# Patient Record
Sex: Female | Born: 1985 | Race: Asian | Hispanic: No | Marital: Married | Smoking: Never smoker
Health system: Southern US, Community
[De-identification: ages and names within clinical notes are randomized; demographics above are authoritative.]

---

## 2012-11-08 ENCOUNTER — Other Ambulatory Visit (HOSPITAL_COMMUNITY)
Admission: RE | Admit: 2012-11-08 | Discharge: 2012-11-08 | Disposition: A | Discharge status: Home / Self Care | Source: Ambulatory Visit | Attending: Obstetrics and Gynecology | Admitting: Obstetrics and Gynecology

## 2012-11-08 ENCOUNTER — Encounter: Payer: Self-pay | Admitting: Obstetrics and Gynecology

## 2012-11-08 ENCOUNTER — Ambulatory Visit (INDEPENDENT_AMBULATORY_CARE_PROVIDER_SITE_OTHER): Admitting: Obstetrics and Gynecology

## 2012-11-08 VITALS — BP 128/78 | HR 70 | Resp 16 | Ht 61.0 in | Wt 148.0 lb

## 2012-11-08 DIAGNOSIS — Z113 Encounter for screening for infections with a predominantly sexual mode of transmission: Secondary | ICD-10-CM | POA: Insufficient documentation

## 2012-11-08 DIAGNOSIS — N914 Secondary oligomenorrhea: Secondary | ICD-10-CM

## 2012-11-08 DIAGNOSIS — Z01419 Encounter for gynecological examination (general) (routine) without abnormal findings: Secondary | ICD-10-CM | POA: Insufficient documentation

## 2012-11-08 DIAGNOSIS — N915 Oligomenorrhea, unspecified: Secondary | ICD-10-CM

## 2012-11-08 DIAGNOSIS — Z124 Encounter for screening for malignant neoplasm of cervix: Secondary | ICD-10-CM

## 2012-11-08 NOTE — Progress Notes (Signed)
  Subjective:     Stephanie Lin is a 27 y.o. female [phone] with LMP 10/08/2012 and BMI 27 who is here for a comprehensive physical exam. The patient reports irregular cycles. Patient also reports presence of facial hair and excessive weight gain. Patient reports a 20 lb weight gain over the past year.She is in the [employer], exercises 5 days a week for 30-45 minutes. She admits to increase in appetite and the onset of change in her cycle over the past year as well. Patient reports menarche at the age of 42. She always had normal monthly cycles lasting 6-8 days. Approximately, a year after the birth of her son, her periods continued to occur monthly but lasted fewer days with the last one in May consisting of 1 day of spotting. Patient became emotional at the thought of something being wrong with her. She would like to return to the [employer] but cannot with the excessive weight gain. She also would like to conceive another child next year but would like to make sure everything is okay with her. About a year ago as well, she noticed coarse facial hair, particularly on shin and sideburns. She reports always having hair on her upper lip which she waxes off. Patient had normal TSH with PCP in April. Patient would also like to be evaluated for BV. She reports a vaginal odor without an abnormal discharge  History   Social History  . Marital Status: Married    Spouse Name: N/A    Number of Children: N/A  . Years of Education: N/A   Occupational History  . Not on file.   Social History Main Topics  . Smoking status: Never Smoker   . Smokeless tobacco: Not on file  . Alcohol Use: Yes     Comment: social  . Drug Use: No  . Sexually Active: Yes   Other Topics Concern  . Not on file   Social History Narrative  . No narrative on file   No health maintenance topics applied.     Review of Systems A comprehensive review of systems was negative.   Objective:      GENERAL: Well-developed,  well-nourished female in no acute distress.  HEENT: Normocephalic, atraumatic. Sclerae anicteric.  NECK: Supple. Normal thyroid.  LUNGS: Clear to auscultation bilaterally.  HEART: Regular rate and rhythm. BREASTS: Symmetric in size. No palpable masses or lymphadenopathy, skin changes, or nipple drainage. ABDOMEN: Soft, nontender, nondistended. No organomegaly. PELVIC: Normal external female genitalia. Vagina is pink and rugated.  Normal discharge. Normal appearing cervix. Uterus is normal in size. No adnexal mass or tenderness. EXTREMITIES: No cyanosis, clubbing, or edema, 2+ distal pulses.    Assessment:    Healthy female exam.      Plan:    pap smear collected and wet prep FSH, LH testosterone also ordered Pelvic ultrasound Patient planning on returning to [place] on Thursday. Will call with results See After Visit Summary for Counseling Recommendations

## 2012-11-08 NOTE — Patient Instructions (Signed)
Preventive Care for Adults, Female A healthy lifestyle and preventive care can promote health and wellness. Preventive health guidelines for women include the following key practices.  A routine yearly physical is a good way to check with your caregiver about your health and preventive screening. It is a chance to share any concerns and updates on your health, and to receive a thorough exam.  Visit your dentist for a routine exam and preventive care every 6 months. Brush your teeth twice a day and floss once a day. Good oral hygiene prevents tooth decay and gum disease.  The frequency of eye exams is based on your age, health, family medical history, use of contact lenses, and other factors. Follow your caregiver's recommendations for frequency of eye exams.  Eat a healthy diet. Foods like vegetables, fruits, whole grains, low-fat dairy products, and lean protein foods contain the nutrients you need without too many calories. Decrease your intake of foods high in solid fats, added sugars, and salt. Eat the right amount of calories for you.Get information about a proper diet from your caregiver, if necessary.  Regular physical exercise is one of the most important things you can do for your health. Most adults should get at least 150 minutes of moderate-intensity exercise (any activity that increases your heart rate and causes you to sweat) each week. In addition, most adults need muscle-strengthening exercises on 2 or more days a week.  Maintain a healthy weight. The body mass index (BMI) is a screening tool to identify possible weight problems. It provides an estimate of body fat based on height and weight. Your caregiver can help determine your BMI, and can help you achieve or maintain a healthy weight.For adults 20 years and older:  A BMI below 18.5 is considered underweight.  A BMI of 18.5 to 24.9 is normal.  A BMI of 25 to 29.9 is considered overweight.  A BMI of 30 and above is  considered obese.  Maintain normal blood lipids and cholesterol levels by exercising and minimizing your intake of saturated fat. Eat a balanced diet with plenty of fruit and vegetables. Blood tests for lipids and cholesterol should begin at age 20 and be repeated every 5 years. If your lipid or cholesterol levels are high, you are over 50, or you are at high risk for heart disease, you may need your cholesterol levels checked more frequently.Ongoing high lipid and cholesterol levels should be treated with medicines if diet and exercise are not effective.  If you smoke, find out from your caregiver how to quit. If you do not use tobacco, do not start.  If you are pregnant, do not drink alcohol. If you are breastfeeding, be very cautious about drinking alcohol. If you are not pregnant and choose to drink alcohol, do not exceed 1 drink per day. One drink is considered to be 12 ounces (355 mL) of beer, 5 ounces (148 mL) of wine, or 1.5 ounces (44 mL) of liquor.  Avoid use of street drugs. Do not share needles with anyone. Ask for help if you need support or instructions about stopping the use of drugs.  High blood pressure causes heart disease and increases the risk of stroke. Your blood pressure should be checked at least every 1 to 2 years. Ongoing high blood pressure should be treated with medicines if weight loss and exercise are not effective.  If you are 55 to 25 years old, ask your caregiver if you should take aspirin to prevent strokes.  Diabetes   screening involves taking a blood sample to check your fasting blood sugar level. This should be done once every 3 years, after age 45, if you are within normal weight and without risk factors for diabetes. Testing should be considered at a younger age or be carried out more frequently if you are overweight and have at least 1 risk factor for diabetes.  Breast cancer screening is essential preventive care for women. You should practice "breast  self-awareness." This means understanding the normal appearance and feel of your breasts and may include breast self-examination. Any changes detected, no matter how small, should be reported to a caregiver. Women in their 20s and 30s should have a clinical breast exam (CBE) by a caregiver as part of a regular health exam every 1 to 3 years. After age 40, women should have a CBE every year. Starting at age 40, women should consider having a mammography (breast X-ray test) every year. Women who have a family history of breast cancer should talk to their caregiver about genetic screening. Women at a high risk of breast cancer should talk to their caregivers about having magnetic resonance imaging (MRI) and a mammography every year.  The Pap test is a screening test for cervical cancer. A Pap test can show cell changes on the cervix that might become cervical cancer if left untreated. A Pap test is a procedure in which cells are obtained and examined from the lower end of the uterus (cervix).  Women should have a Pap test starting at age 21.  Between ages 21 and 29, Pap tests should be repeated every 2 years.  Beginning at age 30, you should have a Pap test every 3 years as long as the past 3 Pap tests have been normal.  Some women have medical problems that increase the chance of getting cervical cancer. Talk to your caregiver about these problems. It is especially important to talk to your caregiver if a new problem develops soon after your last Pap test. In these cases, your caregiver may recommend more frequent screening and Pap tests.  The above recommendations are the same for women who have or have not gotten the vaccine for human papillomavirus (HPV).  If you had a hysterectomy for a problem that was not cancer or a condition that could lead to cancer, then you no longer need Pap tests. Even if you no longer need a Pap test, a regular exam is a good idea to make sure no other problems are  starting.  If you are between ages 65 and 70, and you have had normal Pap tests going back 10 years, you no longer need Pap tests. Even if you no longer need a Pap test, a regular exam is a good idea to make sure no other problems are starting.  If you have had past treatment for cervical cancer or a condition that could lead to cancer, you need Pap tests and screening for cancer for at least 20 years after your treatment.  If Pap tests have been discontinued, risk factors (such as a new sexual partner) need to be reassessed to determine if screening should be resumed.  The HPV test is an additional test that may be used for cervical cancer screening. The HPV test looks for the virus that can cause the cell changes on the cervix. The cells collected during the Pap test can be tested for HPV. The HPV test could be used to screen women aged 30 years and older, and should   be used in women of any age who have unclear Pap test results. After the age of 30, women should have HPV testing at the same frequency as a Pap test.  Colorectal cancer can be detected and often prevented. Most routine colorectal cancer screening begins at the age of 50 and continues through age 75. However, your caregiver may recommend screening at an earlier age if you have risk factors for colon cancer. On a yearly basis, your caregiver may provide home test kits to check for hidden blood in the stool. Use of a small camera at the end of a tube, to directly examine the colon (sigmoidoscopy or colonoscopy), can detect the earliest forms of colorectal cancer. Talk to your caregiver about this at age 50, when routine screening begins. Direct examination of the colon should be repeated every 5 to 10 years through age 75, unless early forms of pre-cancerous polyps or small growths are found.  Hepatitis C blood testing is recommended for all people born from 1945 through 1965 and any individual with known risks for hepatitis C.  Practice  safe sex. Use condoms and avoid high-risk sexual practices to reduce the spread of sexually transmitted infections (STIs). STIs include gonorrhea, chlamydia, syphilis, trichomonas, herpes, HPV, and human immunodeficiency virus (HIV). Herpes, HIV, and HPV are viral illnesses that have no cure. They can result in disability, cancer, and death. Sexually active women aged 25 and younger should be checked for chlamydia. Older women with new or multiple partners should also be tested for chlamydia. Testing for other STIs is recommended if you are sexually active and at increased risk.  Osteoporosis is a disease in which the bones lose minerals and strength with aging. This can result in serious bone fractures. The risk of osteoporosis can be identified using a bone density scan. Women ages 65 and over and women at risk for fractures or osteoporosis should discuss screening with their caregivers. Ask your caregiver whether you should take a calcium supplement or vitamin D to reduce the rate of osteoporosis.  Menopause can be associated with physical symptoms and risks. Hormone replacement therapy is available to decrease symptoms and risks. You should talk to your caregiver about whether hormone replacement therapy is right for you.  Use sunscreen with sun protection factor (SPF) of 30 or more. Apply sunscreen liberally and repeatedly throughout the day. You should seek shade when your shadow is shorter than you. Protect yourself by wearing long sleeves, pants, a wide-brimmed hat, and sunglasses year round, whenever you are outdoors.  Once a month, do a whole body skin exam, using a mirror to look at the skin on your back. Notify your caregiver of new moles, moles that have irregular borders, moles that are larger than a pencil eraser, or moles that have changed in shape or color.  Stay current with required immunizations.  Influenza. You need a dose every fall (or winter). The composition of the flu vaccine  changes each year, so being vaccinated once is not enough.  Pneumococcal polysaccharide. You need 1 to 2 doses if you smoke cigarettes or if you have certain chronic medical conditions. You need 1 dose at age 65 (or older) if you have never been vaccinated.  Tetanus, diphtheria, pertussis (Tdap, Td). Get 1 dose of Tdap vaccine if you are younger than age 65, are over 65 and have contact with an infant, are a healthcare worker, are pregnant, or simply want to be protected from whooping cough. After that, you need a Td   booster dose every 10 years. Consult your caregiver if you have not had at least 3 tetanus and diphtheria-containing shots sometime in your life or have a deep or dirty wound.  HPV. You need this vaccine if you are a woman age 26 or younger. The vaccine is given in 3 doses over 6 months.  Measles, mumps, rubella (MMR). You need at least 1 dose of MMR if you were born in 1957 or later. You may also need a second dose.  Meningococcal. If you are age 19 to 21 and a first-year college student living in a residence hall, or have one of several medical conditions, you need to get vaccinated against meningococcal disease. You may also need additional booster doses.  Zoster (shingles). If you are age 60 or older, you should get this vaccine.  Varicella (chickenpox). If you have never had chickenpox or you were vaccinated but received only 1 dose, talk to your caregiver to find out if you need this vaccine.  Hepatitis A. You need this vaccine if you have a specific risk factor for hepatitis A virus infection or you simply wish to be protected from this disease. The vaccine is usually given as 2 doses, 6 to 18 months apart.  Hepatitis B. You need this vaccine if you have a specific risk factor for hepatitis B virus infection or you simply wish to be protected from this disease. The vaccine is given in 3 doses, usually over 6 months. Preventive Services / Frequency Ages 19 to 39  Blood  pressure check.** / Every 1 to 2 years.  Lipid and cholesterol check.** / Every 5 years beginning at age 20.  Clinical breast exam.** / Every 3 years for women in their 20s and 30s.  Pap test.** / Every 2 years from ages 21 through 29. Every 3 years starting at age 30 through age 65 or 70 with a history of 3 consecutive normal Pap tests.  HPV screening.** / Every 3 years from ages 30 through ages 65 to 70 with a history of 3 consecutive normal Pap tests.  Hepatitis C blood test.** / For any individual with known risks for hepatitis C.  Skin self-exam. / Monthly.  Influenza immunization.** / Every year.  Pneumococcal polysaccharide immunization.** / 1 to 2 doses if you smoke cigarettes or if you have certain chronic medical conditions.  Tetanus, diphtheria, pertussis (Tdap, Td) immunization. / A one-time dose of Tdap vaccine. After that, you need a Td booster dose every 10 years.  HPV immunization. / 3 doses over 6 months, if you are 26 and younger.  Measles, mumps, rubella (MMR) immunization. / You need at least 1 dose of MMR if you were born in 1957 or later. You may also need a second dose.  Meningococcal immunization. / 1 dose if you are age 19 to 21 and a first-year college student living in a residence hall, or have one of several medical conditions, you need to get vaccinated against meningococcal disease. You may also need additional booster doses.  Varicella immunization.** / Consult your caregiver.  Hepatitis A immunization.** / Consult your caregiver. 2 doses, 6 to 18 months apart.  Hepatitis B immunization.** / Consult your caregiver. 3 doses usually over 6 months. Ages 40 to 64  Blood pressure check.** / Every 1 to 2 years.  Lipid and cholesterol check.** / Every 5 years beginning at age 20.  Clinical breast exam.** / Every year after age 40.  Mammogram.** / Every year beginning at age 40   and continuing for as long as you are in good health. Consult with your  caregiver.  Pap test.** / Every 3 years starting at age 30 through age 65 or 70 with a history of 3 consecutive normal Pap tests.  HPV screening.** / Every 3 years from ages 30 through ages 65 to 70 with a history of 3 consecutive normal Pap tests.  Fecal occult blood test (FOBT) of stool. / Every year beginning at age 50 and continuing until age 75. You may not need to do this test if you get a colonoscopy every 10 years.  Flexible sigmoidoscopy or colonoscopy.** / Every 5 years for a flexible sigmoidoscopy or every 10 years for a colonoscopy beginning at age 50 and continuing until age 75.  Hepatitis C blood test.** / For all people born from 1945 through 1965 and any individual with known risks for hepatitis C.  Skin self-exam. / Monthly.  Influenza immunization.** / Every year.  Pneumococcal polysaccharide immunization.** / 1 to 2 doses if you smoke cigarettes or if you have certain chronic medical conditions.  Tetanus, diphtheria, pertussis (Tdap, Td) immunization.** / A one-time dose of Tdap vaccine. After that, you need a Td booster dose every 10 years.  Measles, mumps, rubella (MMR) immunization. / You need at least 1 dose of MMR if you were born in 1957 or later. You may also need a second dose.  Varicella immunization.** / Consult your caregiver.  Meningococcal immunization.** / Consult your caregiver.  Hepatitis A immunization.** / Consult your caregiver. 2 doses, 6 to 18 months apart.  Hepatitis B immunization.** / Consult your caregiver. 3 doses, usually over 6 months. Ages 65 and over  Blood pressure check.** / Every 1 to 2 years.  Lipid and cholesterol check.** / Every 5 years beginning at age 20.  Clinical breast exam.** / Every year after age 40.  Mammogram.** / Every year beginning at age 40 and continuing for as long as you are in good health. Consult with your caregiver.  Pap test.** / Every 3 years starting at age 30 through age 65 or 70 with a 3  consecutive normal Pap tests. Testing can be stopped between 65 and 70 with 3 consecutive normal Pap tests and no abnormal Pap or HPV tests in the past 10 years.  HPV screening.** / Every 3 years from ages 30 through ages 65 or 70 with a history of 3 consecutive normal Pap tests. Testing can be stopped between 65 and 70 with 3 consecutive normal Pap tests and no abnormal Pap or HPV tests in the past 10 years.  Fecal occult blood test (FOBT) of stool. / Every year beginning at age 50 and continuing until age 75. You may not need to do this test if you get a colonoscopy every 10 years.  Flexible sigmoidoscopy or colonoscopy.** / Every 5 years for a flexible sigmoidoscopy or every 10 years for a colonoscopy beginning at age 50 and continuing until age 75.  Hepatitis C blood test.** / For all people born from 1945 through 1965 and any individual with known risks for hepatitis C.  Osteoporosis screening.** / A one-time screening for women ages 65 and over and women at risk for fractures or osteoporosis.  Skin self-exam. / Monthly.  Influenza immunization.** / Every year.  Pneumococcal polysaccharide immunization.** / 1 dose at age 65 (or older) if you have never been vaccinated.  Tetanus, diphtheria, pertussis (Tdap, Td) immunization. / A one-time dose of Tdap vaccine if you are over   65 and have contact with an infant, are a healthcare worker, or simply want to be protected from whooping cough. After that, you need a Td booster dose every 10 years.  Varicella immunization.** / Consult your caregiver.  Meningococcal immunization.** / Consult your caregiver.  Hepatitis A immunization.** / Consult your caregiver. 2 doses, 6 to 18 months apart.  Hepatitis B immunization.** / Check with your caregiver. 3 doses, usually over 6 months. ** Family history and personal history of risk and conditions may change your caregiver's recommendations. Document Released: 07/14/2001 Document Revised: 08/10/2011  Document Reviewed: 10/13/2010 [employer] Patient Information 2014 [employer].  

## 2012-11-09 ENCOUNTER — Ambulatory Visit (HOSPITAL_BASED_OUTPATIENT_CLINIC_OR_DEPARTMENT_OTHER)
Admission: RE | Admit: 2012-11-09 | Discharge: 2012-11-09 | Disposition: A | Discharge status: Home / Self Care | Source: Ambulatory Visit | Attending: Obstetrics and Gynecology | Admitting: Obstetrics and Gynecology

## 2012-11-09 DIAGNOSIS — N914 Secondary oligomenorrhea: Secondary | ICD-10-CM

## 2012-11-09 DIAGNOSIS — N926 Irregular menstruation, unspecified: Secondary | ICD-10-CM | POA: Insufficient documentation

## 2012-11-09 DIAGNOSIS — N83209 Unspecified ovarian cyst, unspecified side: Secondary | ICD-10-CM | POA: Insufficient documentation

## 2012-11-09 DIAGNOSIS — R635 Abnormal weight gain: Secondary | ICD-10-CM | POA: Insufficient documentation

## 2012-11-09 LAB — WET PREP, GENITAL: Yeast Wet Prep HPF POC: NONE SEEN

## 2012-11-09 LAB — TESTOSTERONE: Testosterone: 60 ng/dL (ref 10–70)

## 2013-11-15 LAB — HM MAMMOGRAPHY

## 2014-04-02 ENCOUNTER — Encounter: Payer: Self-pay | Admitting: Obstetrics and Gynecology

## 2014-11-19 ENCOUNTER — Other Ambulatory Visit: Payer: Self-pay | Admitting: Gynecology

## 2015-07-26 ENCOUNTER — Encounter: Payer: Self-pay | Admitting: Physician Assistant

## 2015-07-26 ENCOUNTER — Ambulatory Visit (INDEPENDENT_AMBULATORY_CARE_PROVIDER_SITE_OTHER): Admitting: Physician Assistant

## 2015-07-26 VITALS — BP 128/78 | HR 74 | Ht 61.0 in | Wt 143.0 lb

## 2015-07-26 DIAGNOSIS — R635 Abnormal weight gain: Secondary | ICD-10-CM

## 2015-07-26 DIAGNOSIS — N3001 Acute cystitis with hematuria: Secondary | ICD-10-CM

## 2015-07-26 DIAGNOSIS — R3915 Urgency of urination: Secondary | ICD-10-CM

## 2015-07-26 DIAGNOSIS — R300 Dysuria: Secondary | ICD-10-CM | POA: Diagnosis not present

## 2015-07-26 DIAGNOSIS — M545 Low back pain, unspecified: Secondary | ICD-10-CM

## 2015-07-26 LAB — POCT URINALYSIS DIPSTICK
BILIRUBIN UA: NEGATIVE
GLUCOSE UA: NEGATIVE
Ketones, UA: NEGATIVE
NITRITE UA: NEGATIVE
PH UA: 6
Protein, UA: NEGATIVE
Spec Grav, UA: 1.01
Urobilinogen, UA: 0.2

## 2015-07-26 MED ORDER — AMBULATORY NON FORMULARY MEDICATION: Status: DC

## 2015-07-26 MED ORDER — PHENTERMINE HCL 37.5 MG PO TABS: 37.5000 mg | ORAL_TABLET | Freq: Every day | ORAL | Status: DC

## 2015-07-26 MED ORDER — CIPROFLOXACIN HCL 500 MG PO TABS: 500.0000 mg | ORAL_TABLET | Freq: Two times a day (BID) | ORAL | Status: DC

## 2015-07-26 NOTE — Progress Notes (Signed)
Subjective:    Patient ID: Stephanie Lin, female    DOB: [birth year], 30 y.o.   MRN: [id number]  HPI  Pt is a 30 yo female who presents to the clinic to establish care.   .. Active Ambulatory Problems    Diagnosis Date Noted  . No Active Ambulatory Problems   Resolved Ambulatory Problems    Diagnosis Date Noted  . No Resolved Ambulatory Problems   No Additional Past Medical History   .Marland Kitchen Family History  Problem Relation Age of Onset  . Alcohol abuse Father    .Marland Kitchen Social History   Social History  . Marital Status: Married    Spouse Name: N/A  . Number of Children: N/A  . Years of Education: N/A   Occupational History  . Not on file.   Social History Main Topics  . Smoking status: Never Smoker   . Smokeless tobacco: Not on file  . Alcohol Use: No     Comment: social  . Drug Use: No  . Sexual Activity: Not Currently   Other Topics Concern  . Not on file   Social History Narrative   She has had 1 week of urgency and dysuria. No fever, chills, n/v/d, body aches. Hx of UTI's.   She would like to lose weight. Her husband is cheating on her and she wants to make changes before he leaves her. She has not tried anything.   She does complain of left lower back pain without radiation. No bowel/bladder dysfunction or saddle anthesthesia. [place] in [place] in 09. She was 100 pounds and carring a lot of heavy equipment. No imaging has been done. PT has helped would like to do again. At times feels like left side gives way. She burns and feels tight.     Review of Systems  All other systems reviewed and are negative.      Objective:   Physical Exam  Constitutional: She is oriented to person, place, and time. She appears well-developed and well-nourished.  HENT:  Head: Normocephalic and atraumatic.  Cardiovascular: Normal rate, regular rhythm and normal heart sounds.   Pulmonary/Chest: Effort normal and breath sounds normal.  No CVA tenderness.   Abdominal:  Soft. Bowel sounds are normal. She exhibits no distension and no mass. There is no tenderness. There is no rebound and no guarding.  Adiposity of abdomen with abdominal stretch marks.   Musculoskeletal:  Normal ROM at waist and bilateral lower extermities.  Bilateral patellar reflexes 2+ and symmetric.  Negative straight leg test.  Lumbar left sided paraspinous tenderness and tightness.  Pain induced with internal ROM of left leg.   Neurological: She is alert and oriented to person, place, and time.  Skin: Skin is dry.  Psychiatric: She has a normal mood and affect. Her behavior is normal.          Assessment & Plan:  Acute cystitis with hematuria-.. Results for orders placed or performed in visit on 07/26/15  POCT urinalysis dipstick  Result Value Ref Range   Color, UA yellow    Clarity, UA slightly cloudy    Glucose, UA neg    Bilirubin, UA neg    Ketones, UA neg    Spec Grav, UA 1.010    Blood, UA trace-lysed    pH, UA 6.0    Protein, UA neg    Urobilinogen, UA 0.2    Nitrite, UA neg    Leukocytes, UA Trace (A) Negative   Will treat with cipro. Will  culture. Discussed symptomatic care.   Low back pain, left sided without sciatica- lumbar xray ordered today.  seems more muscular and likely the piriformis. Gave stretches. Ordered PT. Discussed massage, heat, ice and biofreeze. Follow up as needed. Follow up after 4 weeks of PT. Continue NSAIDs as needed.   Abnormal weight gain- discussed options. Interested in cool sculpting but told her I think she would benefit from weight loss first. Phentermine given. Discussed side effects. Follow up in 1 month nurse visit. Encouraged patient to keep to 1500 calorie diet and regular exercise.

## 2015-07-26 NOTE — Patient Instructions (Signed)
Piriformis Syndrome With Rehab Piriformis syndrome is a condition the affects the nervous system in the area of the hip, and is characterized by pain and possibly a loss of feeling in the backside (posterior) thigh that may extend down the entire length of the leg. The symptoms are caused by an increase in pressure on the sciatic nerve by the piriformis muscle, which is on the back of the hip and is responsible for externally rotating the hip. The sciatic nerve and its branches connect to much of the leg. Normally the sciatic nerve runs between the piriformis muscle and other muscles. However, in certain individuals the nerve runs through the muscle, which causes an increase in pressure on the nerve and results in the symptoms of piriformis syndrome. SYMPTOMS   Pain, tingling, numbness, or burning in the back of the thigh that may also extend down the entire leg.  Occasionally, tenderness in the buttock.  Loss of function of the leg.  Pain that worsens when using the piriformis muscle (running, jumping, or stairs).  Pain that increases with prolonged sitting.  Pain that is lessened by lying flat on the back. CAUSES   Piriformis syndrome is the result of an increase in pressure placed on the sciatic nerve. Oftentimes, piriformis syndrome is an overuse injury.  Stress placed on the nerve from a sudden increase in the intensity, frequency, or duration of training.  Compensation of other extremity injuries. RISK INCREASES WITH:  Sports that involve the piriformis muscle (running, walking, or jumping).  You are born with (congenital) a defect in which the sciatic nerve passes through the muscle. PREVENTION  Warm up and stretch properly before activity.  Allow for adequate recovery between workouts.  Maintain physical fitness:  Strength, flexibility, and endurance.  Cardiovascular fitness. PROGNOSIS  If treated properly, the symptoms of piriformis syndrome usually resolve in 2 to 6  weeks. RELATED COMPLICATIONS   Persistent and possibly permanent pain and numbness in the lower extremity.  Weakness of the extremity that may progress to disability and inability to compete. TREATMENT  The most effective treatment for piriformis syndrome is rest from any activities that aggravate the symptoms. Ice and pain medication may help reduce pain and inflammation. The use of strengthening and stretching exercises may help reduce pain with activity. These exercises may be performed at home or with a therapist. A referral to a therapist may be given for further evaluation and treatment, such as ultrasound. Corticosteroid injections may be given to reduce inflammation that is causing pressure to be placed on the sciatic nerve. If nonsurgical (conservative) treatment is unsuccessful, then surgery may be recommended.  MEDICATION   If pain medication is necessary, then nonsteroidal anti-inflammatory medications, such as aspirin and ibuprofen, or other minor pain relievers, such as acetaminophen, are often recommended.  Do not take pain medication for 7 days before surgery.  Prescription pain relievers may be given if deemed necessary by your caregiver. Use only as directed and only as much as you need.  Corticosteroid injections may be given by your caregiver. These injections should be reserved for the most serious cases, because they may only be given a certain number of times. HEAT AND COLD:   Cold treatment (icing) relieves pain and reduces inflammation. Cold treatment should be applied for 10 to 15 minutes every 2 to 3 hours for inflammation and pain and immediately after any activity that aggravates your symptoms. Use ice packs or massage the area with a piece of ice (ice massage).  Heat   treatment may be used prior to performing the stretching and strengthening activities prescribed by your caregiver, physical therapist, or athletic trainer. Use a heat pack or soak the injury in warm  water. SEEK IMMEDIATE MEDICAL CARE IF:  Treatment seems to offer no benefit, or the condition worsens.  Any medications produce adverse side effects. EXERCISES RANGE OF MOTION (ROM) AND STRETCHING EXERCISES - Piriformis Syndrome These exercises may help you when beginning to rehabilitate your injury. Your symptoms may resolve with or without further involvement from your physician, physical therapist, or athletic trainer. While completing these exercises, remember:   Restoring tissue flexibility helps normal motion to return to the joints. This allows healthier, less painful movement and activity.  An effective stretch should be held for at least 30 seconds.  A stretch should never be painful. You should only feel a gentle lengthening or release in the stretched tissue. STRETCH - Hip Rotators  Lie on your back on a firm surface. Grasp your right / left knee with your right / left hand and your ankle with your opposite hand.  Keeping your hips and shoulders firmly planted, gently pull your right / left knee and rotate your lower leg toward your opposite shoulder until you feel a stretch in your buttocks.  Hold this stretch for __________ seconds. Repeat this stretch __________ times. Complete this stretch __________ times per day. STRETCH - Iliotibial Band  On the floor or bed, lie on your side so your right / left leg is on top. Bend your knee and grab your ankle.  Slowly bring your knee back so that your thigh is in line with your trunk. Keep your heel at your buttocks and gently arch your back so your head, shoulders, and hips line up.  Slowly lower your leg so that your knee approaches the floor/bed until you feel a gentle stretch on the outside of your right / left thigh. If you do not feel a stretch and your knee will not fall farther, place the heel of your opposite foot on top of your knee and pull your thigh down farther.  Hold this stretch for __________ seconds. Repeat  __________ times. Complete __________ times per day. STRENGTHENING EXERCISES - Piriformis Syndrome  These are some of the caregiver again or until your symptoms are resolved. Remember:   Strong muscles with good endurance tolerate stress better.  Do the exercises as initially prescribed by your caregiver. Progress slowly with each exercise, gradually increasing the number of repetitions and weight used under their guidance. STRENGTH - Hip Abductors, Straight Leg Raises Be aware of your form throughout the entire exercise so that you exercise the correct muscles. Sloppy form means that you are not strengthening the correct muscles.  Lie on your side so that your head, shoulders, knee, and hip line up. You may bend your lower knee to help maintain your balance. Your right / left leg should be on top.  Roll your hips slightly forward, so that your hips are stacked directly over each other and your right / left knee is facing forward.  Lift your top leg up 4-6 inches, leading with your heel. Be sure that your foot does not drift forward or that your knee does not roll toward the ceiling.  Hold this position for __________ seconds. You should feel the muscles in your outer hip lifting (you may not notice this until your leg begins to tire).  Slowly lower your leg to the starting position. Allow the muscles to fully   relax before beginning the next repetition. Repeat __________ times. Complete this exercise __________ times per day.  STRENGTH - Hip Abductors, Quadruped  On a firm, lightly padded surface, position yourself on your hands and knees. Your hands should be directly below your shoulders and your knees should be directly below your hips.  Keeping your right / left knee bent, lift your leg out to the side. Keep your legs level and in line with your shoulders.  Position yourself on your hands and knees.  Hold for __________ seconds.  Keeping your trunk steady and your hips level, slowly  lower your leg to the starting position. Repeat __________ times. Complete this exercise __________ times per day.  STRENGTH - Hip Abductors, Standing  Tie one end of a rubber exercise band/tubing to a secure surface (table, pole) and tie a loop at the other end.  Place the loop around your right / left ankle. Keeping your ankle with the band directly opposite of the secured end, step away until there is tension in the tube/band.  Hold onto a chair as needed for balance.  Keeping your back upright, your shoulders over your hips, and your toes pointing forward, lift your right / left leg out to your side. Be sure to lift your leg with your hip muscles. Do not "throw" your leg or tip your body to lift your leg.  Slowly and with control, return to the starting position. Repeat exercise __________ times. Complete this exercise __________ times per day.    This information is not intended to replace advice given to you by your health care provider. Make sure you discuss any questions you have with your health care provider.   Document Released: 05/18/2005 Document Revised: 10/02/2014 Document Reviewed: 08/30/2008 Elsevier Interactive Patient Education 2016 Elsevier Inc.  

## 2015-07-28 LAB — URINE CULTURE
COLONY COUNT: NO GROWTH
ORGANISM ID, BACTERIA: NO GROWTH

## 2015-07-29 DIAGNOSIS — M545 Low back pain, unspecified: Secondary | ICD-10-CM | POA: Insufficient documentation

## 2015-07-29 DIAGNOSIS — R635 Abnormal weight gain: Secondary | ICD-10-CM | POA: Insufficient documentation

## 2015-07-31 ENCOUNTER — Ambulatory Visit (INDEPENDENT_AMBULATORY_CARE_PROVIDER_SITE_OTHER): Admitting: Family Medicine

## 2015-07-31 ENCOUNTER — Encounter: Payer: Self-pay | Admitting: Family Medicine

## 2015-07-31 VITALS — BP 132/83 | HR 93 | Temp 98.9°F | Wt 141.0 lb

## 2015-07-31 DIAGNOSIS — M791 Myalgia, unspecified site: Secondary | ICD-10-CM

## 2015-07-31 MED ORDER — OSELTAMIVIR PHOSPHATE 75 MG PO CAPS: 75.0000 mg | ORAL_CAPSULE | Freq: Two times a day (BID) | ORAL | Status: DC

## 2015-07-31 NOTE — Progress Notes (Signed)
CC: Stephanie Lin is a 30 y.o. female is here for Dizziness; Ear Fullness; and Fatigue   Subjective: HPI:  Around noon today she had a sudden sensation of chills, fatigue, lightheadedness, achy back, lack of appetite. Since then she's been resting but no other interventions as of yet. Nothing seems to make symptoms better or worse. She's never had this before. She denies confusion, cough, wheezing, abdominal pain or genitourinary complaints. Son was recently diagnosed with the flu.  Review Of Systems Outlined In HPI  No past medical history on file.  No past surgical history on file. Family History  Problem Relation Age of Onset  . Alcohol abuse Father     Social History   Social History  . Marital Status: Married    Spouse Name: N/A  . Number of Children: N/A  . Years of Education: N/A   Occupational History  . Not on file.   Social History Main Topics  . Smoking status: Never Smoker   . Smokeless tobacco: Not on file  . Alcohol Use: No     Comment: social  . Drug Use: No  . Sexual Activity: Not Currently   Other Topics Concern  . Not on file   Social History Narrative     Objective: BP 132/83 mmHg  Pulse 93  Temp(Src) 98.9 F (37.2 C) (Oral)  Wt 141 lb (63.957 kg)  SpO2 100%  LMP 07/05/2015  General: Alert and Oriented, No Acute Distress HEENT: Pupils equal, round, reactive to light. Conjunctivae clear.  External ears unremarkable, canals clear with intact TMs with appropriate landmarks.  Middle ear appears open without effusion. Pink inferior turbinates.  Moist mucous membranes, pharynx without inflammation nor lesions.  Neck supple without palpable lymphadenopathy nor abnormal masses. Lungs: Clear to auscultation bilaterally, no wheezing/ronchi/rales.  Comfortable work of breathing. Good air movement. Cardiac: Regular rate and rhythm. Normal S1/S2.  No murmurs, rubs, nor gallops.   Extremities: No peripheral edema.  Strong peripheral pulses.  Mental Status:  No depression, anxiety, nor agitation. Skin: Warm and dry.  Assessment & Plan: Paw was seen today for dizziness, ear fullness and fatigue.  Diagnoses and all orders for this visit:  Muscle ache  Other orders -     oseltamivir (TAMIFLU) 75 MG capsule; Take 1 capsule (75 mg total) by mouth 2 (two) times daily.   Clinical suspicion for fluid high enough to where I recommend she start on Tamiflu.Signs and symptoms requring emergent/urgent reevaluation were discussed with the patient. Out of work tomorrow and call if still not better on Friday for another work note.  Return if symptoms worsen or fail to improve.

## 2015-08-23 ENCOUNTER — Ambulatory Visit: Admitting: Physician Assistant

## 2015-09-06 ENCOUNTER — Encounter: Payer: Self-pay | Admitting: Physician Assistant

## 2015-09-06 ENCOUNTER — Ambulatory Visit (INDEPENDENT_AMBULATORY_CARE_PROVIDER_SITE_OTHER): Admitting: Physician Assistant

## 2015-09-06 VITALS — BP 118/73 | HR 64 | Temp 98.5°F | Wt 143.0 lb

## 2015-09-06 DIAGNOSIS — H9201 Otalgia, right ear: Secondary | ICD-10-CM

## 2015-09-06 DIAGNOSIS — R599 Enlarged lymph nodes, unspecified: Secondary | ICD-10-CM | POA: Diagnosis not present

## 2015-09-06 DIAGNOSIS — R234 Changes in skin texture: Secondary | ICD-10-CM | POA: Diagnosis not present

## 2015-09-06 DIAGNOSIS — R590 Localized enlarged lymph nodes: Secondary | ICD-10-CM

## 2015-09-06 MED ORDER — METHYLPREDNISOLONE 4 MG PO TBPK: ORAL_TABLET | ORAL | Status: DC

## 2015-09-07 LAB — COMPLETE METABOLIC PANEL WITH GFR
ALBUMIN: 4.1 g/dL (ref 3.6–5.1)
ALK PHOS: 52 U/L (ref 33–115)
ALT: 10 U/L (ref 6–29)
AST: 13 U/L (ref 10–30)
BILIRUBIN TOTAL: 0.3 mg/dL (ref 0.2–1.2)
BUN: 8 mg/dL (ref 7–25)
CALCIUM: 9.2 mg/dL (ref 8.6–10.2)
CO2: 24 mmol/L (ref 20–31)
Chloride: 105 mmol/L (ref 98–110)
Creat: 0.74 mg/dL (ref 0.50–1.10)
GLUCOSE: 95 mg/dL (ref 65–99)
Potassium: 3.9 mmol/L (ref 3.5–5.3)
Sodium: 141 mmol/L (ref 135–146)
TOTAL PROTEIN: 6.9 g/dL (ref 6.1–8.1)

## 2015-09-07 LAB — CBC WITH DIFFERENTIAL/PLATELET
BASOS ABS: 0 {cells}/uL (ref 0–200)
Basophils Relative: 0 %
Eosinophils Absolute: 291 cells/uL (ref 15–500)
Eosinophils Relative: 3 %
HEMATOCRIT: 38.2 % (ref 35.0–45.0)
HEMOGLOBIN: 12.1 g/dL (ref 11.7–15.5)
LYMPHS ABS: 3104 {cells}/uL (ref 850–3900)
LYMPHS PCT: 32 %
MCH: 25.7 pg — AB (ref 27.0–33.0)
MCHC: 31.7 g/dL — AB (ref 32.0–36.0)
MCV: 81.1 fL (ref 80.0–100.0)
MONO ABS: 582 {cells}/uL (ref 200–950)
Monocytes Relative: 6 %
NEUTROS PCT: 59 %
Neutro Abs: 5723 cells/uL (ref 1500–7800)
Platelets: 150 10*3/uL (ref 140–400)
RBC: 4.71 MIL/uL (ref 3.80–5.10)
RDW: 15.6 % — AB (ref 11.0–15.0)
WBC: 9.7 10*3/uL (ref 3.8–10.8)

## 2015-09-07 LAB — TSH: TSH: 0.75 mIU/L

## 2015-09-09 ENCOUNTER — Telehealth: Payer: Self-pay | Admitting: Physician Assistant

## 2015-09-09 ENCOUNTER — Encounter: Payer: Self-pay | Admitting: Family Medicine

## 2015-09-09 ENCOUNTER — Ambulatory Visit (INDEPENDENT_AMBULATORY_CARE_PROVIDER_SITE_OTHER): Admitting: Family Medicine

## 2015-09-09 VITALS — BP 129/85 | HR 83 | Temp 98.2°F | Wt 141.0 lb

## 2015-09-09 DIAGNOSIS — J029 Acute pharyngitis, unspecified: Secondary | ICD-10-CM | POA: Diagnosis not present

## 2015-09-09 LAB — POCT RAPID STREP A (OFFICE): Rapid Strep A Screen: NEGATIVE

## 2015-09-09 MED ORDER — BENZONATATE 200 MG PO CAPS: 200.0000 mg | ORAL_CAPSULE | Freq: Three times a day (TID) | ORAL | Status: DC | PRN

## 2015-09-09 MED ORDER — IPRATROPIUM BROMIDE 0.06 % NA SOLN: 2.0000 | NASAL | Status: DC | PRN

## 2015-09-09 NOTE — Progress Notes (Addendum)
   Subjective:    Patient ID: Stephanie Lin, female    DOB: 1985/07/24, 30 y.o.   MRN: [id number]  HPI Pt is a 30 yo female who presents to the clinic with right ear pain and knots behind right ear. Denies any fever, chills, n/v/d. Has not tried anything to make better. Noticed knots behind right ear for last week but pain over past few days. Not tried anything to make better. Right ear is popping. Knots behind ears are tender to touch.      Review of Systems  All other systems reviewed and are negative.      Objective:   Physical Exam  Constitutional: She is oriented to person, place, and time. She appears well-developed and well-nourished.  HENT:  Head: Normocephalic and atraumatic.    Right Ear: External ear normal.  Left Ear: External ear normal.  Nose: Nose normal.  Mouth/Throat: Oropharynx is clear and moist. No oropharyngeal exudate.  TM's clear bilaterally.  Eyes: Conjunctivae are normal. Right eye exhibits no discharge. Left eye exhibits no discharge.  Neck: Normal range of motion. Neck supple.  Cardiovascular: Normal rate, regular rhythm and normal heart sounds.   Pulmonary/Chest: Effort normal and breath sounds normal.  Lymphadenopathy:    She has no cervical adenopathy.  Neurological: She is alert and oriented to person, place, and time.  Psychiatric: She has a normal mood and affect. Her behavior is normal.          Assessment & Plan:  Right ear pain/postauricular lymph node enlargement- no signs of infection. Will get cBC. Stop touching lymph nodes. Given medrol dose for inflammation and for any ETD starting in right ear.  Follow up in 4 weeks if not resolved or worsening.   Oily scalp- discussed trying natural products as shampoo and conditioner.   Reassured pt on phentermine to start.

## 2015-09-09 NOTE — Telephone Encounter (Signed)
Pt wants to know if you  would want her to come in before April 28th because she is scheduled for coolscuplting on 4/28. She has also  lost 3 lbs. Do you reccommed her moving her appointment to May for the coolscupting. Please let her now what you have decided  [phone].

## 2015-09-09 NOTE — Patient Instructions (Signed)
Thank you for coming in today. Call or go to the emergency room if you get worse, have trouble breathing, have chest pains, or palpitations.   Take tylenol or ibuprofen.  Use the nasal spray.   Pharyngitis Pharyngitis is redness, pain, and swelling (inflammation) of your pharynx.  CAUSES  Pharyngitis is usually caused by infection. Most of the time, these infections are from viruses (viral) and are part of a cold. However, sometimes pharyngitis is caused by bacteria (bacterial). Pharyngitis can also be caused by allergies. Viral pharyngitis may be spread from person to person by coughing, sneezing, and personal items or utensils (cups, forks, spoons, toothbrushes). Bacterial pharyngitis may be spread from person to person by more intimate contact, such as kissing.  SIGNS AND SYMPTOMS  Symptoms of pharyngitis include:   Sore throat.   Tiredness (fatigue).   Low-grade fever.   Headache.  Joint pain and muscle aches.  Skin rashes.  Swollen lymph nodes.  Plaque-like film on throat or tonsils (often seen with bacterial pharyngitis). DIAGNOSIS  Your health care provider will ask you questions about your illness and your symptoms. Your medical history, along with a physical exam, is often all that is needed to diagnose pharyngitis. Sometimes, a rapid strep test is done. Other lab tests may also be done, depending on the suspected cause.  TREATMENT  Viral pharyngitis will usually get better in 3-4 days without the use of medicine. Bacterial pharyngitis is treated with medicines that kill germs (antibiotics).  HOME CARE INSTRUCTIONS   Drink enough water and fluids to keep your urine clear or pale yellow.   Only take over-the-counter or prescription medicines as directed by your health care provider:   If you are prescribed antibiotics, make sure you finish them even if you start to feel better.   Do not take aspirin.   Get lots of rest.   Gargle with 8 oz of salt water (  tsp of salt per 1 qt of water) as often as every 1-2 hours to soothe your throat.   Throat lozenges (if you are not at risk for choking) or sprays may be used to soothe your throat. SEEK MEDICAL CARE IF:   You have large, tender lumps in your neck.  You have a rash.  You cough up green, yellow-brown, or bloody spit. SEEK IMMEDIATE MEDICAL CARE IF:   Your neck becomes stiff.  You drool or are unable to swallow liquids.  You vomit or are unable to keep medicines or liquids down.  You have severe pain that does not go away with the use of recommended medicines.  You have trouble breathing (not caused by a stuffy nose). MAKE SURE YOU:   Understand these instructions.  Will watch your condition.  Will get help right away if you are not doing well or get worse.   This information is not intended to replace advice given to you by your health care provider. Make sure you discuss any questions you have with your health care provider.   Document Released: 05/18/2005 Document Revised: 03/08/2013 Document Reviewed: 01/23/2013 Elsevier Interactive Patient Education [employer]2016 Elsevier Inc.

## 2015-09-09 NOTE — Telephone Encounter (Signed)
LMOM notifying pt of recommendations. 

## 2015-09-09 NOTE — Assessment & Plan Note (Signed)
Viral pharyngitis. Discussed treatment options. Plan for Atrovent nasal spray Tessalon Perles and Tylenol or ibuprofen. Watchful waiting return as needed.

## 2015-09-09 NOTE — Telephone Encounter (Signed)
I do recommend waiting until lost the weight you would like before cool scuplting. i do think you will get better response.

## 2015-09-09 NOTE — Progress Notes (Signed)
       Stephanie Lin is a 30 y.o. female who [facility] today for sore throat. Patient notes a one-day history of severe sore throat. She was seen a few days ago for ear pain thought to be due to viral cause. She was given Medrol Dosepak that she did not tolerate. She notes right swollen lymph nodes also. Symptoms are moderate. She's tried honey which has not helped. She has not tried any other medications. Her son was recently sick. No fevers chills nausea vomiting or diarrhea. Additionally she notes a moderate nonproductive cough. She denies any significant trouble breathing or chest pain.   No past medical history on file. No past surgical history on file. Social History  Substance Use Topics  . Smoking status: Never Smoker   . Smokeless tobacco: Not on file  . Alcohol Use: No     Comment: social   family history includes Alcohol abuse in her father.  ROS as above Medications: Current Outpatient Prescriptions  Medication Sig Dispense Refill  . phentermine (ADIPEX-P) 37.5 MG tablet Take 1 tablet (37.5 mg total) by mouth daily before breakfast. 30 tablet 0  . benzonatate (TESSALON) 200 MG capsule Take 1 capsule (200 mg total) by mouth 3 (three) times daily as needed for cough. 45 capsule 0  . ipratropium (ATROVENT) 0.06 % nasal spray Place 2 sprays into both nostrils every 4 (four) hours as needed for rhinitis. 10 mL 6   No current facility-administered medications for this visit.   Allergies  Allergen Reactions  . Cherry Anaphylaxis  . Iodine Anaphylaxis  . 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 Shellfish Allergy Anaphylaxis     Exam:  BP 129/85 mmHg  Pulse 83  Temp(Src) 98.2 F (36.8 C) (Oral)  Wt 141 lb (63.957 kg)  SpO2 98% Gen: Well NAD Nontoxic appearing HEENT: EOMI,  MMM posterior pharynx with cobblestoning. No severe erythema and tonsillar hypertrophy or  exudate.. Normal tympanic membranes bilaterally. Right cervical lymphadenopathy small tender all less than 1 cm. Lungs: Normal work of breathing. CTABL Heart: RRR no MRG Abd: NABS, Soft. Nondistended, Nontender Exts: Brisk capillary refill, warm and well perfused.   Results for orders placed or performed in visit on 09/09/15 (from the past 24 hour(s))  POCT rapid strep A     Status: Normal   Collection Time: 09/09/15 10:14 AM  Result Value Ref Range   Rapid Strep A Screen Negative Negative   No results found.   Please see individual assessment and plan sections.

## 2015-09-11 ENCOUNTER — Telehealth: Payer: Self-pay

## 2015-09-11 MED ORDER — PREDNISONE 10 MG PO TABS: 30.0000 mg | ORAL_TABLET | Freq: Every day | ORAL | Status: DC

## 2015-09-11 NOTE — Telephone Encounter (Signed)
Patient advised.

## 2015-09-11 NOTE — Telephone Encounter (Signed)
I sent in prednisone as the prednisone is likely to be more helpful than harmful at this time.

## 2015-09-11 NOTE — Telephone Encounter (Signed)
Stephanie Lin called and states she still feels bad. She still has the laryngitis, runny nose and cough. She would like your recommendations.

## 2016-02-14 ENCOUNTER — Other Ambulatory Visit: Payer: Self-pay

## 2016-02-14 ENCOUNTER — Other Ambulatory Visit: Payer: Self-pay | Admitting: Physician Assistant

## 2016-02-14 DIAGNOSIS — Z111 Encounter for screening for respiratory tuberculosis: Secondary | ICD-10-CM

## 2016-02-17 LAB — QUANTIFERON TB GOLD ASSAY (BLOOD)
Interferon Gamma Release Assay: NEGATIVE
Mitogen-Nil: 3.01 IU/mL
Quantiferon Nil Value: 0.04 IU/mL
Quantiferon Tb Ag Minus Nil Value: 0 IU/mL

## 2016-02-19 ENCOUNTER — Encounter: Payer: Self-pay | Admitting: Physician Assistant

## 2016-02-19 ENCOUNTER — Ambulatory Visit (INDEPENDENT_AMBULATORY_CARE_PROVIDER_SITE_OTHER): Admitting: Physician Assistant

## 2016-02-19 ENCOUNTER — Encounter: Admitting: Physician Assistant

## 2016-02-19 VITALS — BP 119/80 | HR 85 | Temp 98.4°F

## 2016-02-19 DIAGNOSIS — Z23 Encounter for immunization: Secondary | ICD-10-CM | POA: Diagnosis not present

## 2016-02-19 NOTE — Progress Notes (Signed)
Stephanie Lin presents to the clinic for a flu injection.  Denies allergies to eggs, latex, and past allergic reaction to the vaccine.  Pt tolerated injection well in the left deltoid.  Pt stated that she have an appointment with PCP next Tuesday. -[facility]

## 2016-02-24 ENCOUNTER — Other Ambulatory Visit: Payer: Self-pay | Admitting: Family Medicine

## 2016-02-24 ENCOUNTER — Ambulatory Visit
Admission: RE | Admit: 2016-02-24 | Discharge: 2016-02-24 | Disposition: A | Discharge status: Home / Self Care | Source: Ambulatory Visit | Attending: Family Medicine | Admitting: Family Medicine

## 2016-02-24 ENCOUNTER — Ambulatory Visit (INDEPENDENT_AMBULATORY_CARE_PROVIDER_SITE_OTHER): Admitting: Family Medicine

## 2016-02-24 ENCOUNTER — Ambulatory Visit: Admission: RE | Admit: 2016-02-24 | Source: Ambulatory Visit

## 2016-02-24 ENCOUNTER — Encounter: Payer: Self-pay | Admitting: Family Medicine

## 2016-02-24 VITALS — BP 117/70 | HR 80

## 2016-02-24 DIAGNOSIS — N644 Mastodynia: Secondary | ICD-10-CM | POA: Diagnosis not present

## 2016-02-24 DIAGNOSIS — N63 Unspecified lump in breast: Secondary | ICD-10-CM

## 2016-02-24 DIAGNOSIS — N6321 Unspecified lump in the left breast, upper outer quadrant: Secondary | ICD-10-CM

## 2016-02-24 DIAGNOSIS — N6452 Nipple discharge: Secondary | ICD-10-CM

## 2016-02-24 NOTE — Progress Notes (Signed)
   Subjective:    Patient ID: Stephanie Lin, female    DOB: 1986/05/17, 30 y.o.   MRN: [id number]  HPI She has pain in her left breast and discharge that started 4 days ago. No D/C today.  She did have a diagnostic mammogram performed in June 2015 which was normal for similar symptoms but eventually the discharge resolved on its own. She is adopted so does not know her family history. She says the discharge has a yellowish color and a little bit of blood. She says this time it was painful which was unusual. She denies any recent exercise for change and broad aware. No fevers or chills. She's not currently sexually active. In fact she is finalizing her divorce in November..    Review of Systems   BP 117/70   Pulse 80   SpO2 100%     Allergies  Allergen Reactions  . Cherry Anaphylaxis  . Iodine Anaphylaxis  . 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 Shellfish Allergy Anaphylaxis    No past medical history on file.  No past surgical history on file.  Social History   Social History  . Marital status: Married    Spouse name: N/A  . Number of children: N/A  . Years of education: N/A   Occupational History  . Not on file.   Social History Main Topics  . Smoking status: Never Smoker  . Smokeless tobacco: Not on file  . Alcohol use No     Comment: social  . Drug use: No  . Sexual activity: Not Currently   Other Topics Concern  . Not on file   Social History Narrative  . No narrative on file    Family History  Problem Relation Age of Onset  . Alcohol abuse Father     No outpatient encounter prescriptions on file as of 02/24/2016.   No facility-administered encounter medications on file as of 02/24/2016.           Objective:   Physical Exam  Constitutional: She is oriented to person, place, and time. She appears well-developed and well-nourished.  HENT:  Head: Normocephalic and atraumatic.  Eyes: Conjunctivae and EOM are normal.  Cardiovascular: Normal rate.    Pulmonary/Chest: Effort normal.  Left Br - small firm lesion at the 2 o'clock position, 2.5 cm from edge of areola, approx 1 cm round in size. Right breast exam is normal. No axillary lymphadenopathy.   Neurological: She is alert and oriented to person, place, and time.  Skin: Skin is dry. No pallor.  Psychiatric: She has a normal mood and affect. Her behavior is normal.  Vitals reviewed.       Assessment & Plan:  Left breast pain/discharge/breast lump-we'll schedule for diagnostic mammogram and ultrasound for further evaluation. In the meantime make sure it wearing good supportive bra where an moisturizing the breast well.

## 2016-02-25 ENCOUNTER — Ambulatory Visit (INDEPENDENT_AMBULATORY_CARE_PROVIDER_SITE_OTHER): Admitting: Physician Assistant

## 2016-02-25 ENCOUNTER — Encounter: Payer: Self-pay | Admitting: Physician Assistant

## 2016-02-25 VITALS — BP 128/66 | HR 71 | Ht 61.0 in | Wt 147.0 lb

## 2016-02-25 DIAGNOSIS — Z131 Encounter for screening for diabetes mellitus: Secondary | ICD-10-CM | POA: Diagnosis not present

## 2016-02-25 DIAGNOSIS — N6092 Unspecified benign mammary dysplasia of left breast: Secondary | ICD-10-CM

## 2016-02-25 DIAGNOSIS — R635 Abnormal weight gain: Secondary | ICD-10-CM

## 2016-02-25 DIAGNOSIS — Z0000 Encounter for general adult medical examination without abnormal findings: Secondary | ICD-10-CM

## 2016-02-25 DIAGNOSIS — N6002 Solitary cyst of left breast: Secondary | ICD-10-CM

## 2016-02-25 DIAGNOSIS — Z13220 Encounter for screening for lipoid disorders: Secondary | ICD-10-CM

## 2016-02-25 NOTE — Progress Notes (Signed)
Subjective:     Stephanie Lin is a 30 y.o. female and is here for a comprehensive physical exam. The patient reports she would like to discuss results of her breast ultrasound due to breast mass. .  Social History   Social History  . Marital status: Married    Spouse name: N/A  . Number of children: N/A  . Years of education: N/A   Occupational History  . Not on file.   Social History Main Topics  . Smoking status: Never Smoker  . Smokeless tobacco: Not on file  . Alcohol use No     Comment: social  . Drug use: No  . Sexual activity: Not Currently   Other Topics Concern  . Not on file   Social History Narrative  . No narrative on file   Health Maintenance  Topic Date Due  . HIV Screening  09/15/2002  . TETANUS/TDAP  09/15/2006  . PAP SMEAR  11/09/2015  . INFLUENZA VACCINE  Completed    The following portions of the patient's history were reviewed and updated as appropriate: allergies, current medications, past family history, past medical history, past social history, past surgical history and problem list.  Review of Systems Pertinent items noted in HPI and remainder of comprehensive ROS otherwise negative.   Objective:    BP 128/66   Pulse 71   Ht 5\' 1"  (1.549 m)   Wt 147 lb (66.7 kg)   BMI 27.78 kg/m  General appearance: alert and cooperative Head: Normocephalic, without obvious abnormality, atraumatic Eyes: conjunctivae/corneas clear. PERRL, EOM's intact. Fundi benign. Ears: normal TM's and external ear canals both ears Nose: Nares normal. Septum midline. Mucosa normal. No drainage or sinus tenderness. Throat: lips, mucosa, and tongue normal; teeth and gums normal Neck: no adenopathy, no carotid bruit, no JVD, supple, symmetrical, trachea midline and thyroid not enlarged, symmetric, no tenderness/mass/nodules Back: symmetric, no curvature. ROM normal. No CVA tenderness. Lungs: clear to auscultation bilaterally Heart: regular rate and rhythm, S1, S2  normal, no murmur, click, rub or gallop Abdomen: soft, non-tender; bowel sounds normal; no masses,  no organomegaly Extremities: extremities normal, atraumatic, no cyanosis or edema Pulses: 2+ and symmetric Skin: Skin color, texture, turgor normal. No rashes or lesions Lymph nodes: Cervical, supraclavicular, and axillary nodes normal. Neurologic: Alert and oriented X 3, normal strength and tone. Normal symmetric reflexes. Normal coordination and gait    Assessment:    Healthy female exam.      Plan:    CPE/abnormal weight gain- pap up to date. Lipid, tsh, cmp ordered today. Pt already had flu shot. Discussed exercise 150 minutes a week and 1500mg  of calcium a day and vitamin D 800 units a day. Start with diet and exercise. Did not tolerate phentermine in past. Will consider wellbutrin.   Benign cyst of left breast- went over u/s from yesterday. Reassurance given. Will recheck with u/s in 6 months.  See After Visit Summary for Counseling Recommendations

## 2016-02-25 NOTE — Patient Instructions (Signed)

## 2016-02-26 LAB — COMPLETE METABOLIC PANEL WITH GFR
ALBUMIN: 3.9 g/dL (ref 3.6–5.1)
ALK PHOS: 59 U/L (ref 33–115)
ALT: 20 U/L (ref 6–29)
AST: 21 U/L (ref 10–30)
BILIRUBIN TOTAL: 0.4 mg/dL (ref 0.2–1.2)
BUN: 8 mg/dL (ref 7–25)
CO2: 23 mmol/L (ref 20–31)
CREATININE: 0.82 mg/dL (ref 0.50–1.10)
Calcium: 8.8 mg/dL (ref 8.6–10.2)
Chloride: 105 mmol/L (ref 98–110)
GFR, Est African American: >89 mL/min (ref >=60)
GLUCOSE: 85 mg/dL (ref 65–99)
Potassium: 4.2 mmol/L (ref 3.5–5.3)
SODIUM: 138 mmol/L (ref 135–146)
TOTAL PROTEIN: 6.9 g/dL (ref 6.1–8.1)

## 2016-02-26 LAB — LIPID PANEL
Cholesterol: 185 mg/dL (ref 125–200)
HDL: 34 mg/dL — AB (ref >=46)
LDL CALC: 126 mg/dL (ref <130)
Total CHOL/HDL Ratio: 5.4 Ratio — ABNORMAL HIGH (ref <=5.0)
Triglycerides: 125 mg/dL (ref <150)
VLDL: 25 mg/dL (ref <30)

## 2016-02-26 LAB — TSH: TSH: 1.34 mIU/L

## 2016-03-03 ENCOUNTER — Encounter: Payer: Self-pay | Admitting: Physician Assistant

## 2016-03-05 ENCOUNTER — Telehealth: Payer: Self-pay | Admitting: *Deleted

## 2016-03-05 DIAGNOSIS — R239 Unspecified skin changes: Secondary | ICD-10-CM

## 2016-03-05 DIAGNOSIS — R635 Abnormal weight gain: Secondary | ICD-10-CM

## 2016-03-05 NOTE — Telephone Encounter (Signed)
Labs ordered.

## 2016-03-06 ENCOUNTER — Encounter: Payer: Self-pay | Admitting: Physician Assistant

## 2016-03-27 ENCOUNTER — Ambulatory Visit: Admitting: Physician Assistant

## 2016-03-31 ENCOUNTER — Ambulatory Visit (INDEPENDENT_AMBULATORY_CARE_PROVIDER_SITE_OTHER): Admitting: Sports Medicine

## 2016-03-31 ENCOUNTER — Encounter: Payer: Self-pay | Admitting: Sports Medicine

## 2016-03-31 ENCOUNTER — Ambulatory Visit (INDEPENDENT_AMBULATORY_CARE_PROVIDER_SITE_OTHER)

## 2016-03-31 DIAGNOSIS — J111 Influenza due to unidentified influenza virus with other respiratory manifestations: Secondary | ICD-10-CM | POA: Insufficient documentation

## 2016-03-31 DIAGNOSIS — J029 Acute pharyngitis, unspecified: Secondary | ICD-10-CM

## 2016-03-31 DIAGNOSIS — R69 Illness, unspecified: Principal | ICD-10-CM

## 2016-03-31 DIAGNOSIS — R05 Cough: Secondary | ICD-10-CM | POA: Diagnosis not present

## 2016-03-31 DIAGNOSIS — R0989 Other specified symptoms and signs involving the circulatory and respiratory systems: Secondary | ICD-10-CM

## 2016-03-31 DIAGNOSIS — R5383 Other fatigue: Secondary | ICD-10-CM

## 2016-03-31 DIAGNOSIS — M791 Myalgia: Secondary | ICD-10-CM

## 2016-03-31 LAB — POCT INFLUENZA A/B
Influenza A, POC: NEGATIVE
Influenza B, POC: NEGATIVE

## 2016-03-31 MED ORDER — AZITHROMYCIN 250 MG PO TABS: ORAL_TABLET | ORAL | 0 refills | Status: DC

## 2016-03-31 MED ORDER — BENZONATATE 200 MG PO CAPS: 200.0000 mg | ORAL_CAPSULE | Freq: Three times a day (TID) | ORAL | 0 refills | Status: DC | PRN

## 2016-03-31 MED ORDER — OSELTAMIVIR PHOSPHATE 75 MG PO CAPS: 75.0000 mg | ORAL_CAPSULE | Freq: Two times a day (BID) | ORAL | 0 refills | Status: DC

## 2016-03-31 NOTE — Progress Notes (Signed)
  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 Running nose. No shortness of breath, chest pain. No nausea, vomiting, diarrhea.  Past medical history:  Negative.  See flowsheet/record as well for more information.  Surgical history: Negative.  See flowsheet/record as well for more information.  Family history: Negative.  See flowsheet/record as well for more information.  Social history: Negative.  See flowsheet/record as well for more information.  Allergies, and medications have been entered into the medical record, reviewed, and no changes needed.   Review of Systems: No fevers, chills, night sweats, weight loss, chest pain, or shortness of breath.   Objective:    General: Well Developed, well nourished, and in no acute distress.  Neuro: Alert and oriented x3, extra-ocular muscles intact, sensation grossly intact.  HEENT: Normocephalic, atraumatic, pupils equal round reactive to light, neck supple, no masses, no lymphadenopathy, thyroid nonpalpable. Oropharynx, nasopharynx, ear canals unremarkable. Skin: Warm and dry, no rashes. Cardiac: Regular rate and rhythm, no murmurs rubs or gallops, no lower extremity edema.  Respiratory: Coarse sounds in the right middle lobe. Not using accessory muscles, speaking in full sentences.  Chest x-ray reviewed and is clear.  Impression and Recommendations:    Influenza-like illness Chest x-ray, azithromycin, Tessalon Perles, Tamiflu. Over-the-counter Tylenol Cold and sinus. Return to see me in 2 weeks as needed. Out of work for 2 days.

## 2016-03-31 NOTE — Assessment & Plan Note (Signed)
Chest x-ray, azithromycin, Tessalon Perles, Tamiflu. Over-the-counter Tylenol Cold and sinus. Return to see me in 2 weeks as needed. Out of work for 2 days.

## 2016-03-31 NOTE — Addendum Note (Signed)
Addended by: Baird KayUGLAS, Norma Montemurro M on: 03/31/2016 11:50 AM   Modules accepted: Orders

## 2016-04-01 LAB — TESTOSTERONE: TESTOSTERONE: 51 ng/dL

## 2016-04-01 LAB — PROGESTERONE: PROGESTERONE: 2.7 ng/mL

## 2016-04-01 LAB — FOLLICLE STIMULATING HORMONE: FSH: 4.5 m[IU]/mL

## 2016-04-01 LAB — LUTEINIZING HORMONE: LH: 4.8 m[IU]/mL

## 2016-04-01 LAB — PROLACTIN: Prolactin: 10 ng/mL

## 2016-04-01 NOTE — Telephone Encounter (Signed)
Birth control can help with PCOS symptoms.

## 2016-04-01 NOTE — Telephone Encounter (Signed)
Call pt: you are not in early menopause. FSH looks good.  Labs in normal.  Testosterone is a little elevated which likely represents some androgenic production from ovaries such as PCOS. Could be making it harder to lose weight.

## 2016-04-03 LAB — DHEA: DHEA: 208 ng/dL (ref 102–1185)

## 2016-04-08 LAB — ESTRADIOL, FREE
ESTRADIOL FREE: 2.06 pg/mL
Estradiol: 110 pg/mL

## 2016-04-28 ENCOUNTER — Ambulatory Visit: Admitting: Physician Assistant

## 2016-09-01 ENCOUNTER — Telehealth: Payer: Self-pay | Admitting: Physician Assistant

## 2016-09-01 NOTE — Telephone Encounter (Signed)
-----   Message from Jomarie Longs, [place] sent at 02/25/2016 12:04 PM EDT ----- Regarding: breast left u/s Recheck benign breast cyst left breast.

## 2016-09-01 NOTE — Telephone Encounter (Signed)
I have a reminder to recheck with u/s breast cyst/left. Has this been ordered?

## 2016-09-03 NOTE — Telephone Encounter (Signed)
Pt just had this done at [facility].  Results are not in Care Everywhere.  Pt sees the physician next week to go over results & will sign a release to have results sent here.

## 2016-09-07 ENCOUNTER — Encounter: Payer: Self-pay | Admitting: Physician Assistant

## 2016-09-07 ENCOUNTER — Ambulatory Visit (INDEPENDENT_AMBULATORY_CARE_PROVIDER_SITE_OTHER): Admitting: Physician Assistant

## 2016-09-07 VITALS — BP 116/80 | HR 108 | Temp 98.3°F | Ht 61.0 in | Wt 143.0 lb

## 2016-09-07 DIAGNOSIS — J0100 Acute maxillary sinusitis, unspecified: Secondary | ICD-10-CM | POA: Diagnosis not present

## 2016-09-07 MED ORDER — AMOXICILLIN-POT CLAVULANATE 875-125 MG PO TABS: 1.0000 | ORAL_TABLET | Freq: Two times a day (BID) | ORAL | 0 refills | Status: DC

## 2016-09-07 NOTE — Patient Instructions (Signed)

## 2016-09-07 NOTE — Progress Notes (Signed)
   Subjective:    Patient ID: Stephanie Lin, female    DOB: 07-17-1985, 31 y.o.   MRN: [id number]  HPI  Pt is a 31 yo female who presents to the clinic with URI symptoms with new onset sinus pressure, headache, facial pain. Symptoms present for last 12 days. No fever. She has felt off and on chills. No SOB or wheezing. She is taking dayquil. 74 year old son had sinus infection and ear infection this week as well. dayquil helps some but she is not getting better.     Review of Systems    see HPi.  Objective:   Physical Exam  Constitutional: She is oriented to person, place, and time. She appears well-developed and well-nourished.  HENT:  Head: Normocephalic and atraumatic.  Right Ear: External ear normal.  Left Ear: External ear normal.  Nose: Nose normal.  Mouth/Throat: No oropharyngeal exudate.  TM's clear.  Tenderness over maxillary sinuses.  orpharynx erythematous without exudate.  Bilateral red and swollen nasal turbinates.   Eyes: Conjunctivae are normal. Right eye exhibits no discharge. Left eye exhibits no discharge.  Neck: Normal range of motion. Neck supple.  Cardiovascular: Normal rate, regular rhythm and normal heart sounds.   Pulmonary/Chest: Effort normal and breath sounds normal. She has no wheezes.  Lymphadenopathy:    She has cervical adenopathy.  Neurological: She is alert and oriented to person, place, and time.  Psychiatric: She has a normal mood and affect. Her behavior is normal.          Assessment & Plan:  Marland KitchenMarland KitchenDiagnoses and all orders for this visit:  Acute non-recurrent maxillary sinusitis -     amoxicillin-clavulanate (AUGMENTIN) 875-125 MG tablet; Take 1 tablet by mouth 2 (two) times daily. For 10 days.   Symptomatic care discussed.  Finish augmentin.  Consider flonase.  Follow up as needed.

## 2017-04-13 ENCOUNTER — Ambulatory Visit (INDEPENDENT_AMBULATORY_CARE_PROVIDER_SITE_OTHER): Admitting: Family Medicine

## 2017-04-13 ENCOUNTER — Telehealth: Payer: Self-pay | Admitting: Physician Assistant

## 2017-04-13 ENCOUNTER — Encounter: Payer: Self-pay | Admitting: Family Medicine

## 2017-04-13 ENCOUNTER — Other Ambulatory Visit (HOSPITAL_COMMUNITY)
Admission: RE | Admit: 2017-04-13 | Discharge: 2017-04-13 | Disposition: A | Discharge status: Home / Self Care | Payer: Self-pay | Source: Ambulatory Visit | Attending: Family Medicine | Admitting: Family Medicine

## 2017-04-13 VITALS — BP 124/76 | HR 78 | Ht 61.0 in | Wt 146.0 lb

## 2017-04-13 DIAGNOSIS — R319 Hematuria, unspecified: Secondary | ICD-10-CM

## 2017-04-13 DIAGNOSIS — Z124 Encounter for screening for malignant neoplasm of cervix: Secondary | ICD-10-CM | POA: Insufficient documentation

## 2017-04-13 DIAGNOSIS — Z1151 Encounter for screening for human papillomavirus (HPV): Secondary | ICD-10-CM | POA: Insufficient documentation

## 2017-04-13 DIAGNOSIS — R1031 Right lower quadrant pain: Secondary | ICD-10-CM

## 2017-04-13 DIAGNOSIS — N898 Other specified noninflammatory disorders of vagina: Secondary | ICD-10-CM

## 2017-04-13 LAB — WET PREP FOR TRICH, YEAST, CLUE
MICRO NUMBER: 81277628
SPECIMEN QUALITY 3963: ADEQUATE

## 2017-04-13 LAB — POCT URINALYSIS DIPSTICK
Bilirubin, UA: NEGATIVE
Glucose, UA: NEGATIVE
Ketones, UA: NEGATIVE
Leukocytes, UA: NEGATIVE
NITRITE UA: NEGATIVE
PH UA: 5.5 (ref 5.0–8.0)
PROTEIN UA: NEGATIVE
Spec Grav, UA: 1.025 (ref 1.010–1.025)
UROBILINOGEN UA: 0.2 U/dL

## 2017-04-13 NOTE — Telephone Encounter (Signed)
Patient called about Urine culture that she had done this morning and hasn't heard anything back yet. Pt request to get a call back today if possible.

## 2017-04-13 NOTE — Progress Notes (Signed)
Subjective:    Patient ID: Stephanie ButtePari Lamb, female    DOB: 10-May-1986, 31 y.o.   MRN: [id number]  HPI 31 year old female comes in today complaining of lower abdominal pain and possible retained tampon for about a week.  She is not having any itching or irritation but has had a vaginal odor.  She separated from her husband and divorced about 4 years ago and has not been with a new partner since but he cheated on her and she is a little concerned.  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  Review of Systems  BP 124/76   Pulse 78   Ht 5\' 1"  (1.549 m)   Wt 146 lb (66.2 kg)   LMP 04/05/2017   SpO2 100%   BMI 27.59 kg/m     Allergies  Allergen Reactions  . Cherry Anaphylaxis  . Iodine Anaphylaxis  . 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 Shellfish Allergy Anaphylaxis    History reviewed. No pertinent past medical history.  History reviewed. No pertinent surgical history.  Social History   Socioeconomic History  . Marital status: Married    Spouse name: Not on file  . Number of children: Not on file  . Years of education: Not on file  . Highest education level: Not on file  Social Needs  . Financial resource strain: Not on file  . Food insecurity - worry: Not on file  . Food insecurity - inability: Not on file  . Transportation needs - medical: Not on file  . Transportation needs - non-medical: Not on file  Occupational History  . Not on file  Tobacco Use  . Smoking status: Never Smoker  . Smokeless tobacco: Never Used  Substance and Sexual Activity  . Alcohol use: No    Comment: social  . Drug use: No  . Sexual activity: Not Currently  Other Topics Concern  . Not on file  Social History Narrative  . Not on file    Family History  Problem Relation Age of Onset  . Alcohol abuse Father     Outpatient Encounter Medications as of 04/13/2017  Medication Sig  . [DISCONTINUED] amoxicillin-clavulanate (AUGMENTIN) 875-125 MG tablet Take 1  tablet by mouth 2 (two) times daily. For 10 days.   No facility-administered encounter medications on file as of 04/13/2017.          Objective:   Physical Exam  Constitutional: She is oriented to person, place, and time. She appears well-developed and well-nourished.  HENT:  Head: Normocephalic and atraumatic.  Eyes: Conjunctivae and EOM are normal.  Cardiovascular: Normal rate.  Pulmonary/Chest: Effort normal.  Abdominal: Hernia confirmed negative in the right inguinal area and confirmed negative in the left inguinal area.  Mildly tender in the right lower quadrant and suprapubic area.  Genitourinary: There is no rash, tenderness, lesion or injury on the right labia. There is no rash, tenderness, lesion or injury on the left labia. There is tenderness in the vagina. No erythema or bleeding in the vagina. No foreign body in the vagina. No signs of injury around the vagina. Vaginal discharge found.  Lymphadenopathy:       Right: No inguinal adenopathy present.       Left: No inguinal adenopathy present.  Neurological: She is alert and oriented to person, place, and time.  Skin: Skin is dry. No pallor.  Psychiatric: She has a normal mood and affect. Her behavior is normal.  Vitals reviewed.  Assessment & Plan:  Vaginitis-likely bacterial vaginitis will send wet prep stat.  Went ahead and performed her Pap smear as she is due.  Will add GC, chlamydia and HPV to her Pap smear.  She is concerned so we will do this additional testing.  Right lower quadrant pain-unclear etiology at this point we will have her go ahead and do a urinalysis as well.

## 2017-04-14 NOTE — Telephone Encounter (Signed)
Patient advised the urine culture will take a couple of days.

## 2017-04-15 LAB — CYTOLOGY - PAP
Chlamydia: NEGATIVE
Diagnosis: NEGATIVE
HPV: NOT DETECTED
NEISSERIA GONORRHEA: NEGATIVE

## 2017-04-15 LAB — URINE CULTURE
MICRO NUMBER: 81283915
RESULT: NO GROWTH
SPECIMEN QUALITY: ADEQUATE

## 2017-04-15 LAB — URINALYSIS, MICROSCOPIC ONLY
BACTERIA UA: NONE SEEN /HPF
Hyaline Cast: NONE SEEN /LPF
RBC / HPF: NONE SEEN /HPF (ref 0–2)
Squamous Epithelial / LPF: NONE SEEN /HPF (ref <=5)
WBC, UA: NONE SEEN /HPF (ref 0–5)

## 2017-04-16 ENCOUNTER — Other Ambulatory Visit: Payer: Self-pay | Admitting: *Deleted

## 2017-04-16 DIAGNOSIS — R102 Pelvic and perineal pain: Secondary | ICD-10-CM

## 2017-06-06 IMAGING — DX DG CHEST 2V
2 series · 2 of 2 positions shown · non-contrast
Comparison: None in PACs

CLINICAL DATA: Several days of increasing body aches with cough and
sore throat as well as malaise, fatigue, and runny nose.

EXAM:
CHEST  2 VIEW

[chest pa]
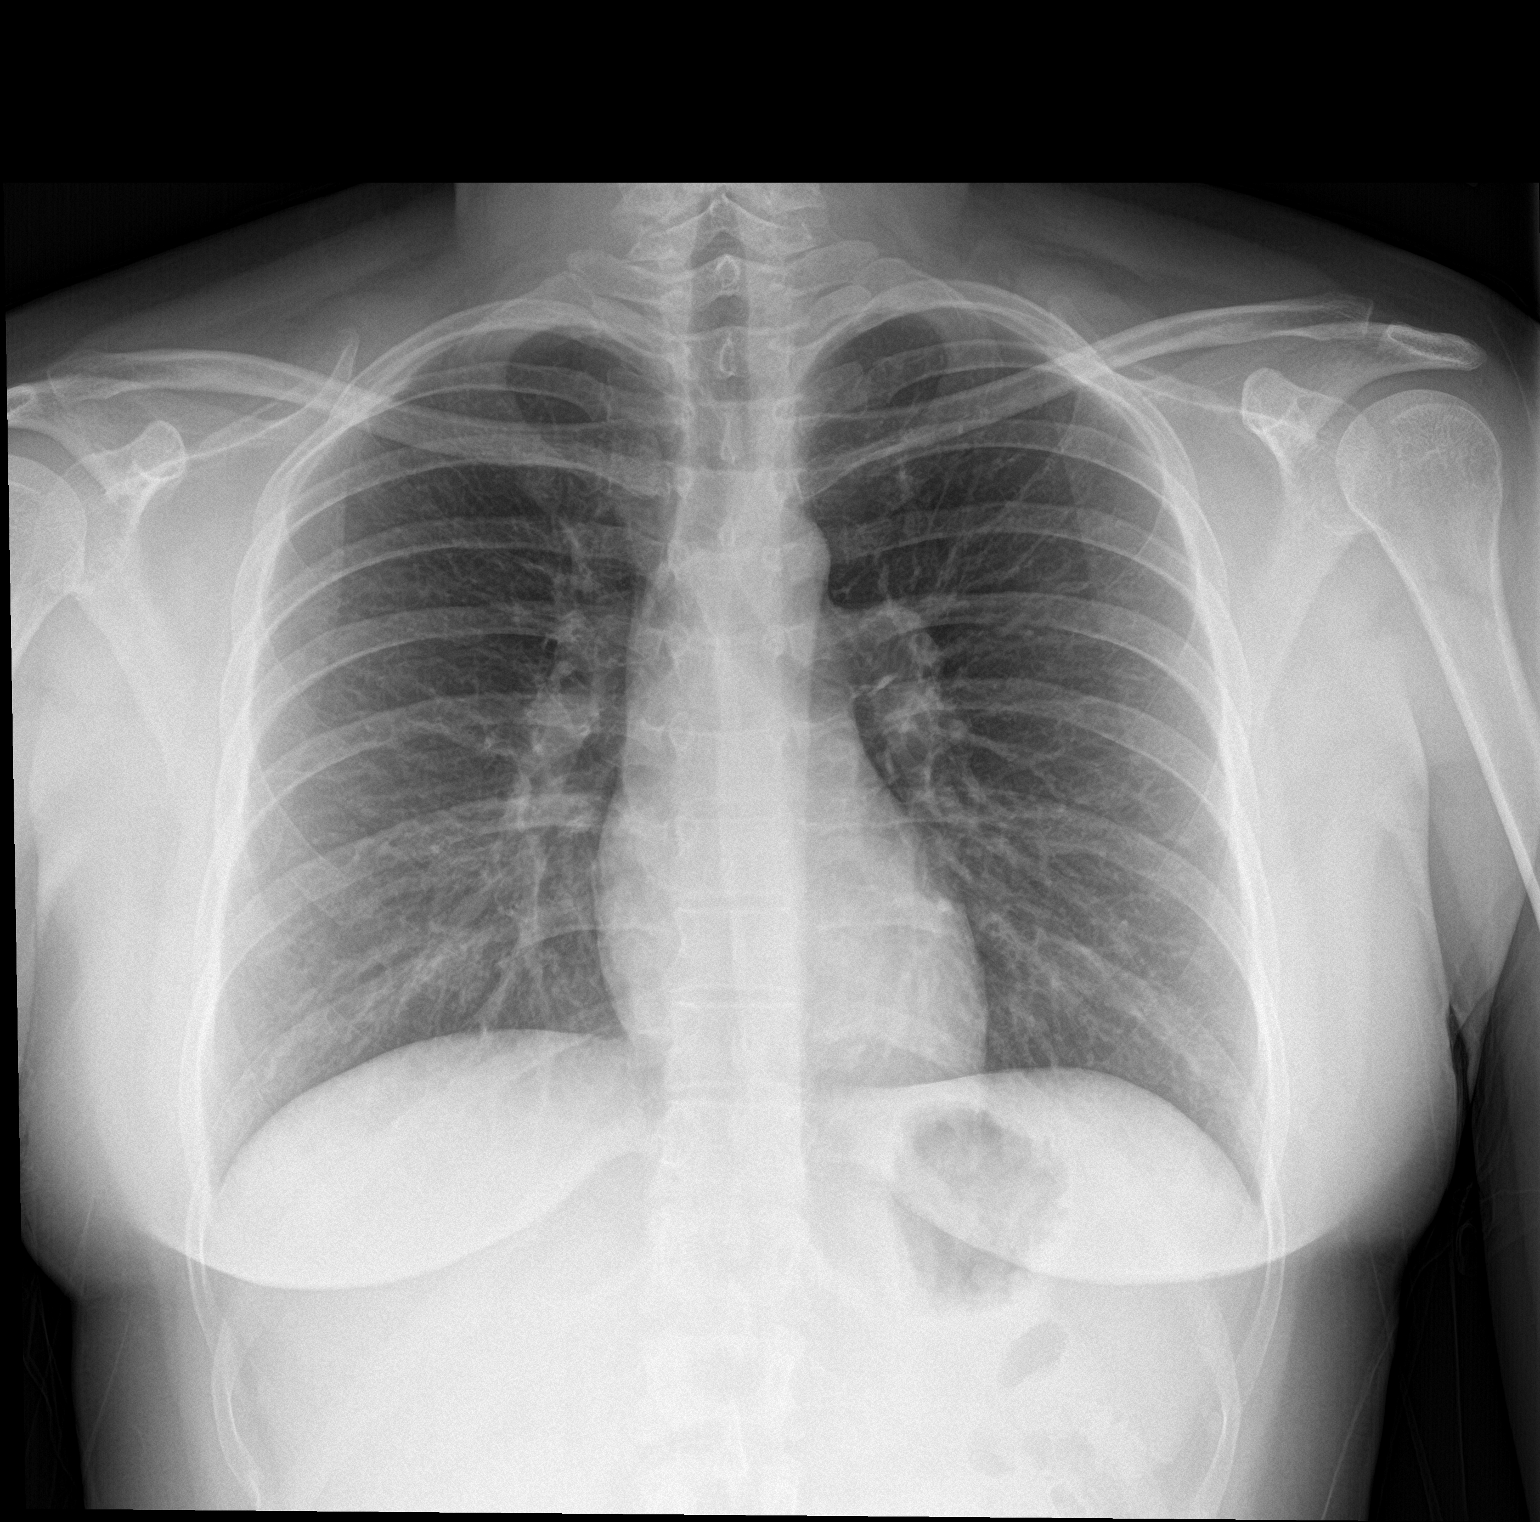

[chest lat]
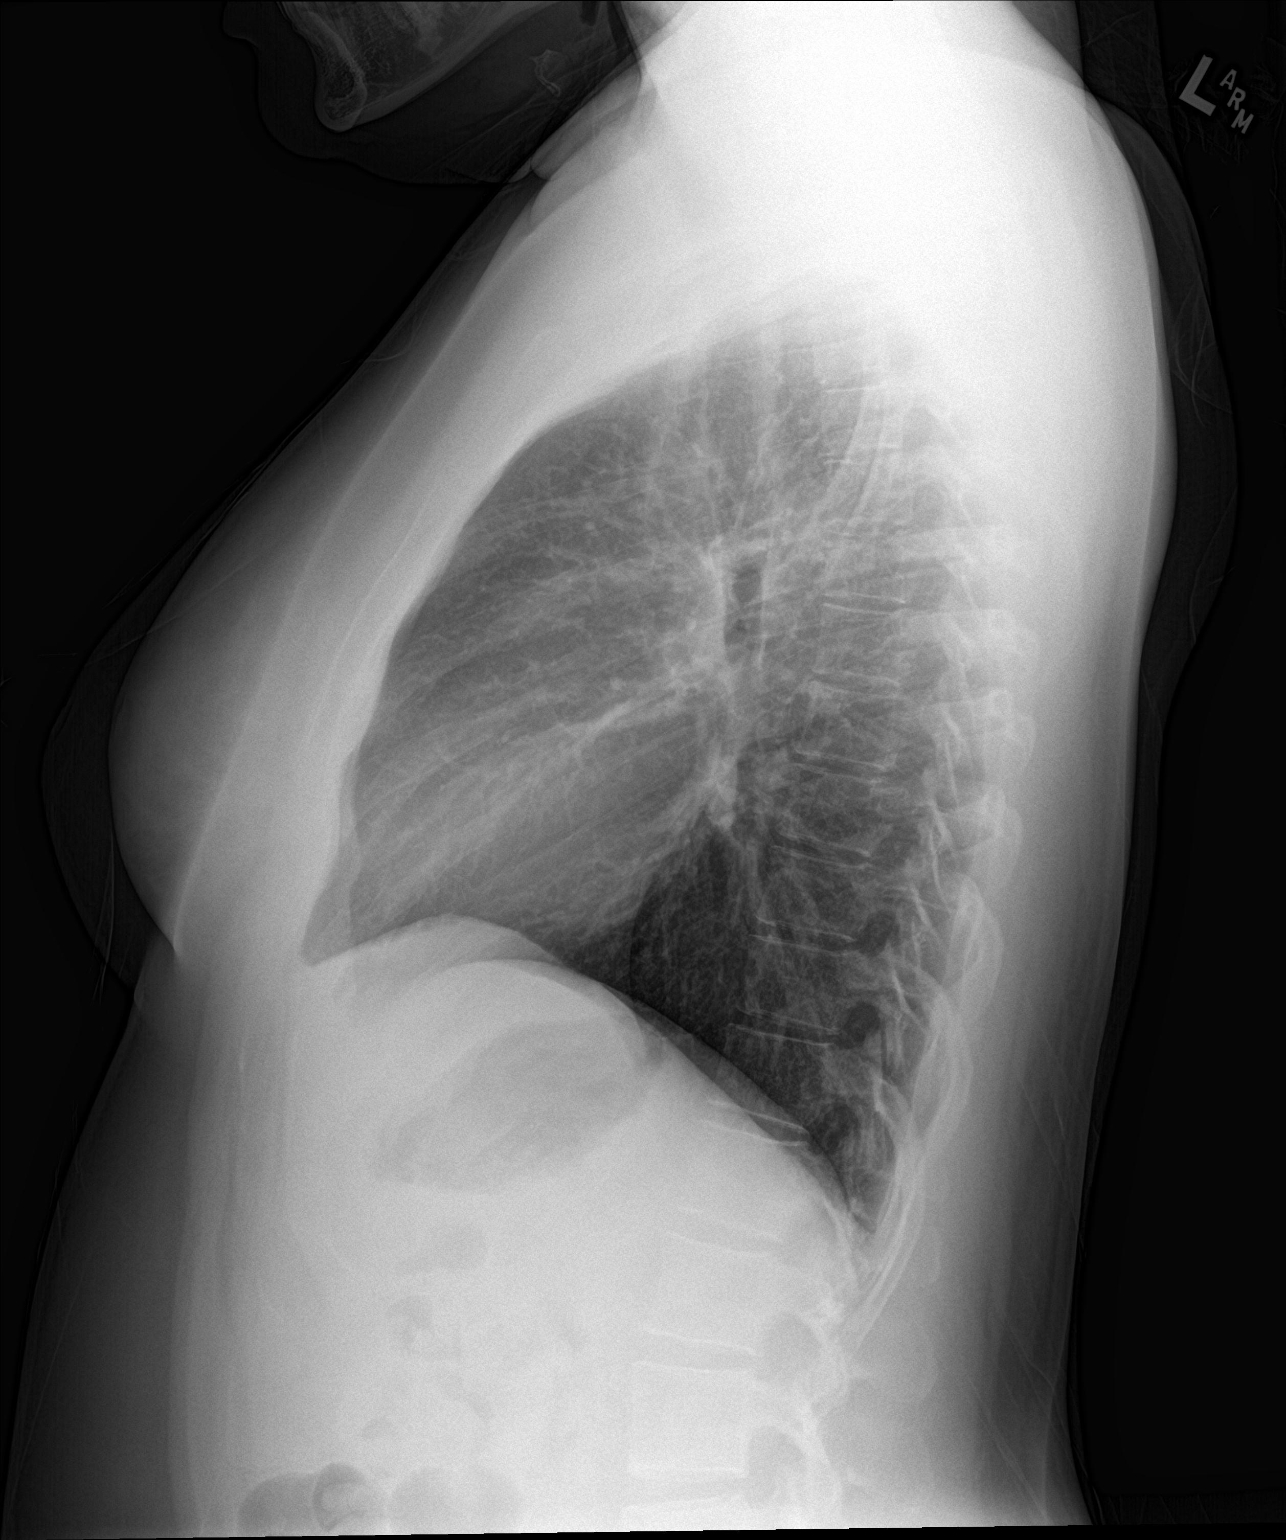

[2 of 2 positions shown; findings below may reference images not displayed]

FINDINGS: The lungs are well-expanded and clear. The heart and pulmonary
vascularity are normal. The mediastinum is normal in width. There is
no pleural effusion. The bony thorax exhibits no acute abnormality.
IMPRESSION: There is no active cardiopulmonary disease.
